# Patient Record
Sex: Male | Born: 1990 | Race: White | Hispanic: No | Marital: Married | Smoking: Current some day smoker
Health system: Southern US, Community
[De-identification: ages and names within clinical notes are randomized; demographics above are authoritative.]

## PROBLEM LIST (undated history)

## (undated) DIAGNOSIS — J45909 Unspecified asthma, uncomplicated: Secondary | ICD-10-CM

---
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
## 2009-04-15 ENCOUNTER — Emergency Department (HOSPITAL_COMMUNITY): Admission: EM | Admit: 2009-04-15 | Discharge: 2009-04-15 | Payer: Self-pay | Admitting: Emergency Medicine

## 2019-01-10 ENCOUNTER — Encounter (HOSPITAL_COMMUNITY): Payer: Self-pay

## 2019-01-10 ENCOUNTER — Ambulatory Visit (HOSPITAL_COMMUNITY)
Admission: EM | Admit: 2019-01-10 | Discharge: 2019-01-10 | Disposition: A | Discharge status: Home / Self Care | Payer: 59 | Attending: Family Medicine | Admitting: Family Medicine

## 2019-01-10 ENCOUNTER — Other Ambulatory Visit: Payer: Self-pay

## 2019-01-10 ENCOUNTER — Ambulatory Visit (INDEPENDENT_AMBULATORY_CARE_PROVIDER_SITE_OTHER): Payer: 59

## 2019-01-10 DIAGNOSIS — J4541 Moderate persistent asthma with (acute) exacerbation: Secondary | ICD-10-CM

## 2019-01-10 DIAGNOSIS — R0602 Shortness of breath: Secondary | ICD-10-CM | POA: Diagnosis not present

## 2019-01-10 DIAGNOSIS — R0789 Other chest pain: Secondary | ICD-10-CM | POA: Diagnosis not present

## 2019-01-10 HISTORY — DX: Unspecified asthma, uncomplicated: J45.909

## 2019-01-10 LAB — POC SARS CORONAVIRUS 2 AG -  ED: SARS Coronavirus 2 Ag: NEGATIVE

## 2019-01-10 LAB — POC SARS CORONAVIRUS 2 AG: SARS Coronavirus 2 Ag: NEGATIVE

## 2019-01-10 MED ORDER — PREDNISONE 20 MG PO TABS: ORAL_TABLET | ORAL | 0 refills | Status: AC

## 2019-01-10 NOTE — ED Provider Notes (Signed)
[place]    CSN: [id number] Arrival date & time: 01/10/19  1153      History   Chief Complaint Chief Complaint  Patient presents with  . Chest Pain    HPI Shawn Bray is a 28 y.o. male.   27 year old man making his initial [facility] Urgent care visit.  He is complaining about chest pain.  He has a history of asthma and his wife reports that he is wheezing more when he lies on his left side.  Onset two days ago.  Using inhalers as usual but the tightness in chest is not clearing.  Patient works as a Administrator, sports.  He normally gets asthma flares in the fall.     Past Medical History:  Diagnosis Date  . Asthma     There are no active problems to display for this patient.   History reviewed. No pertinent surgical history.     Home Medications    Prior to Admission medications   Medication Sig Start Date End Date Taking? Authorizing Provider  predniSONE (DELTASONE) 20 MG tablet Two daily with food 01/10/19   Robyn Haber, MD    Family History Family History  Problem Relation Age of Onset  . COPD Mother   . Healthy Father     Social History Social History   Tobacco Use  . Smoking status: Current Some Day Smoker  . Smokeless tobacco: Never Used  Substance Use Topics  . Alcohol use: Yes    Comment: weekends  . Drug use: Not on file     Allergies   Patient has no known allergies.   Review of Systems Review of Systems  Constitutional: Negative for appetite change, chills, diaphoresis, fatigue and fever.  HENT: Positive for congestion.   Respiratory: Positive for shortness of breath. Negative for cough.   Gastrointestinal: Negative.   Musculoskeletal: Negative.   All other systems reviewed and are negative.    Physical Exam Triage Vital Signs ED Triage Vitals  Enc Vitals Group     BP 01/10/19 1346 121/81     Pulse Rate 01/10/19 1346 63     Resp 01/10/19 1346 16     Temp 01/10/19 1346 98.2 F (36.8 C)     Temp Source  01/10/19 1346 Oral     SpO2 01/10/19 1346 99 %     Weight --      Height --      Head Circumference --      Peak Flow --      Pain Score 01/10/19 1344 0     Pain Loc --      Pain Edu? --      Excl. in [place]? --    No data found.  Updated Vital Signs BP 121/81 (BP Location: Right Arm)   Pulse 63   Temp 98.2 F (36.8 C) (Oral)   Resp 16   SpO2 99%    Physical Exam Vitals signs and nursing note reviewed.  Constitutional:      Appearance: He is well-developed and normal weight.  HENT:     Head: Normocephalic.  Eyes:     Extraocular Movements: Extraocular movements intact.  Neck:     Musculoskeletal: Normal range of motion and neck supple.  Cardiovascular:     Rate and Rhythm: Normal rate and regular rhythm.     Heart sounds: Normal heart sounds.  Pulmonary:     Effort: Pulmonary effort is normal.     Breath sounds: Examination of the  right-lower field reveals wheezing. Examination of the left-lower field reveals wheezing. Wheezing present.  Skin:    General: Skin is warm and dry.  Neurological:     General: No focal deficit present.     Mental Status: He is alert.  Psychiatric:        Mood and Affect: Mood normal.        Behavior: Behavior normal.      UC Treatments / Results  Labs (all labs ordered are listed, but only abnormal results are displayed) Labs Reviewed  POC SARS CORONAVIRUS 2 AG -  ED  POC SARS CORONAVIRUS 2 AG    EKG   Radiology Dg Chest 2 View  Result Date: 01/10/2019 CLINICAL DATA:  Shortness of breath, wheezing EXAM: CHEST - 2 VIEW COMPARISON:  None. FINDINGS: The heart size and mediastinal contours are within normal limits. Both lungs are clear. The visualized skeletal structures are unremarkable. IMPRESSION: No active cardiopulmonary disease. Electronically Signed   By: Duanne Guess M.D.   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 Linden Dolin feels that is a summation of rib and posterior fat pad Procedures Procedures (including critical care time)  Medications Ordered in UC Medications - No data to display  Initial Impression / Assessment and Plan / UC Course  I have reviewed the triage vital signs and the nursing notes.  Pertinent labs & imaging results that were available during my care of the patient were reviewed by me and considered in my medical decision making (see chart for details).    Final Clinical Impressions(s) / UC Diagnoses   Final diagnoses:  Moderate persistent asthma with acute exacerbation     Discharge Instructions     Your rapid COVID-19 test is negative.  We will run a confirmatory test that takes 3-5 days to run.    ED Prescriptions    Medication Sig Dispense Auth. Provider   predniSONE (DELTASONE) 20 MG tablet Two daily with food 10 tablet Elvina Sidle, MD     I have reviewed the PDMP during this encounter.   Elvina Sidle, MD 01/10/19 [phone]

## 2019-01-10 NOTE — Discharge Instructions (Addendum)
Your rapid COVID-19 test is negative.  We will run a confirmatory test that takes 3-5 days to run.  You have mild curvature of the spine on the x-ray which tends to make asthma more noticeable.  I've called in some prednisone to be taken twice daily.  If symptoms worsen or fail to improve over next 48 hrs, please return or go to Emergency Dept.

## 2019-01-10 NOTE — ED Triage Notes (Signed)
Patient presents to Urgent Care with complaints of chest pain on the left side since two nights ago. Patient reports he only has the pain when he exhales, hx of asthma, uses inhaler. Pt states his wife complains of the pt wheezing a lot when he sleeps on one side.

## 2020-10-17 IMAGING — DX DG CHEST 2V
2 series · 2 of 2 positions shown · non-contrast
Comparison: None.

CLINICAL DATA: Shortness of breath, wheezing

EXAM:
CHEST - 2 VIEW

[chest pa]
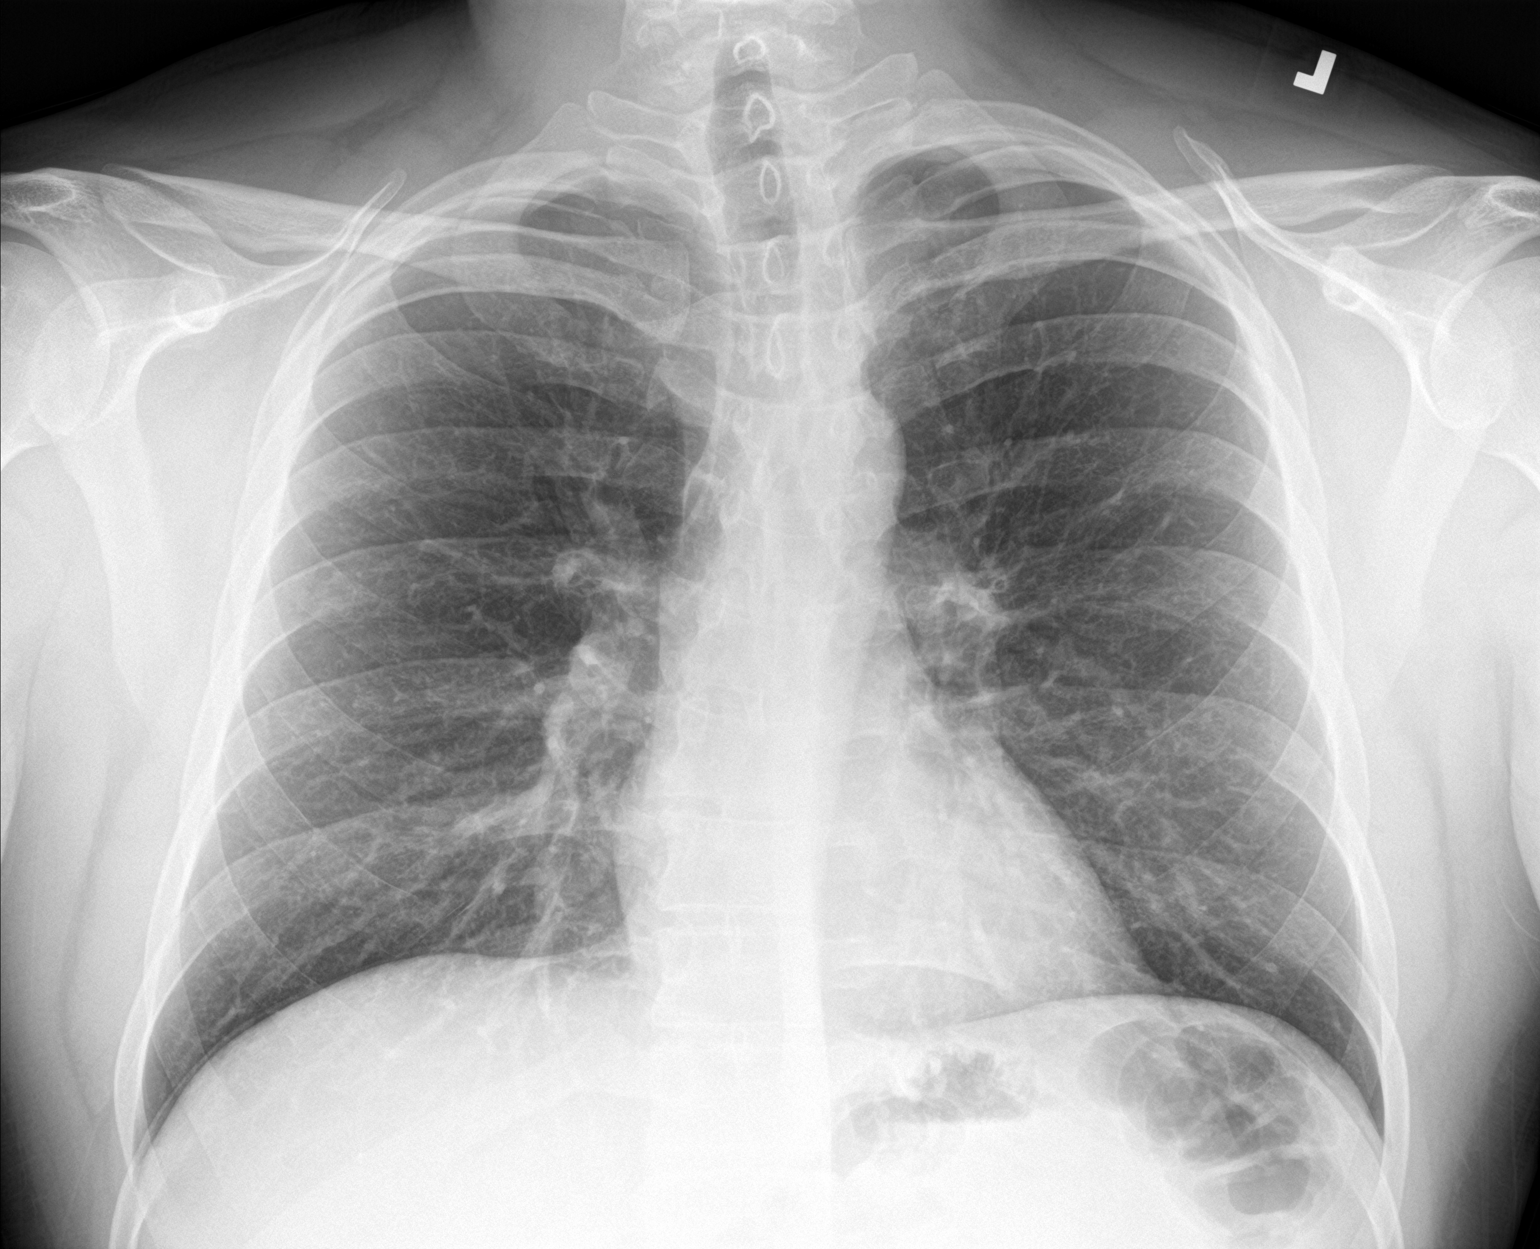

[chest lat]
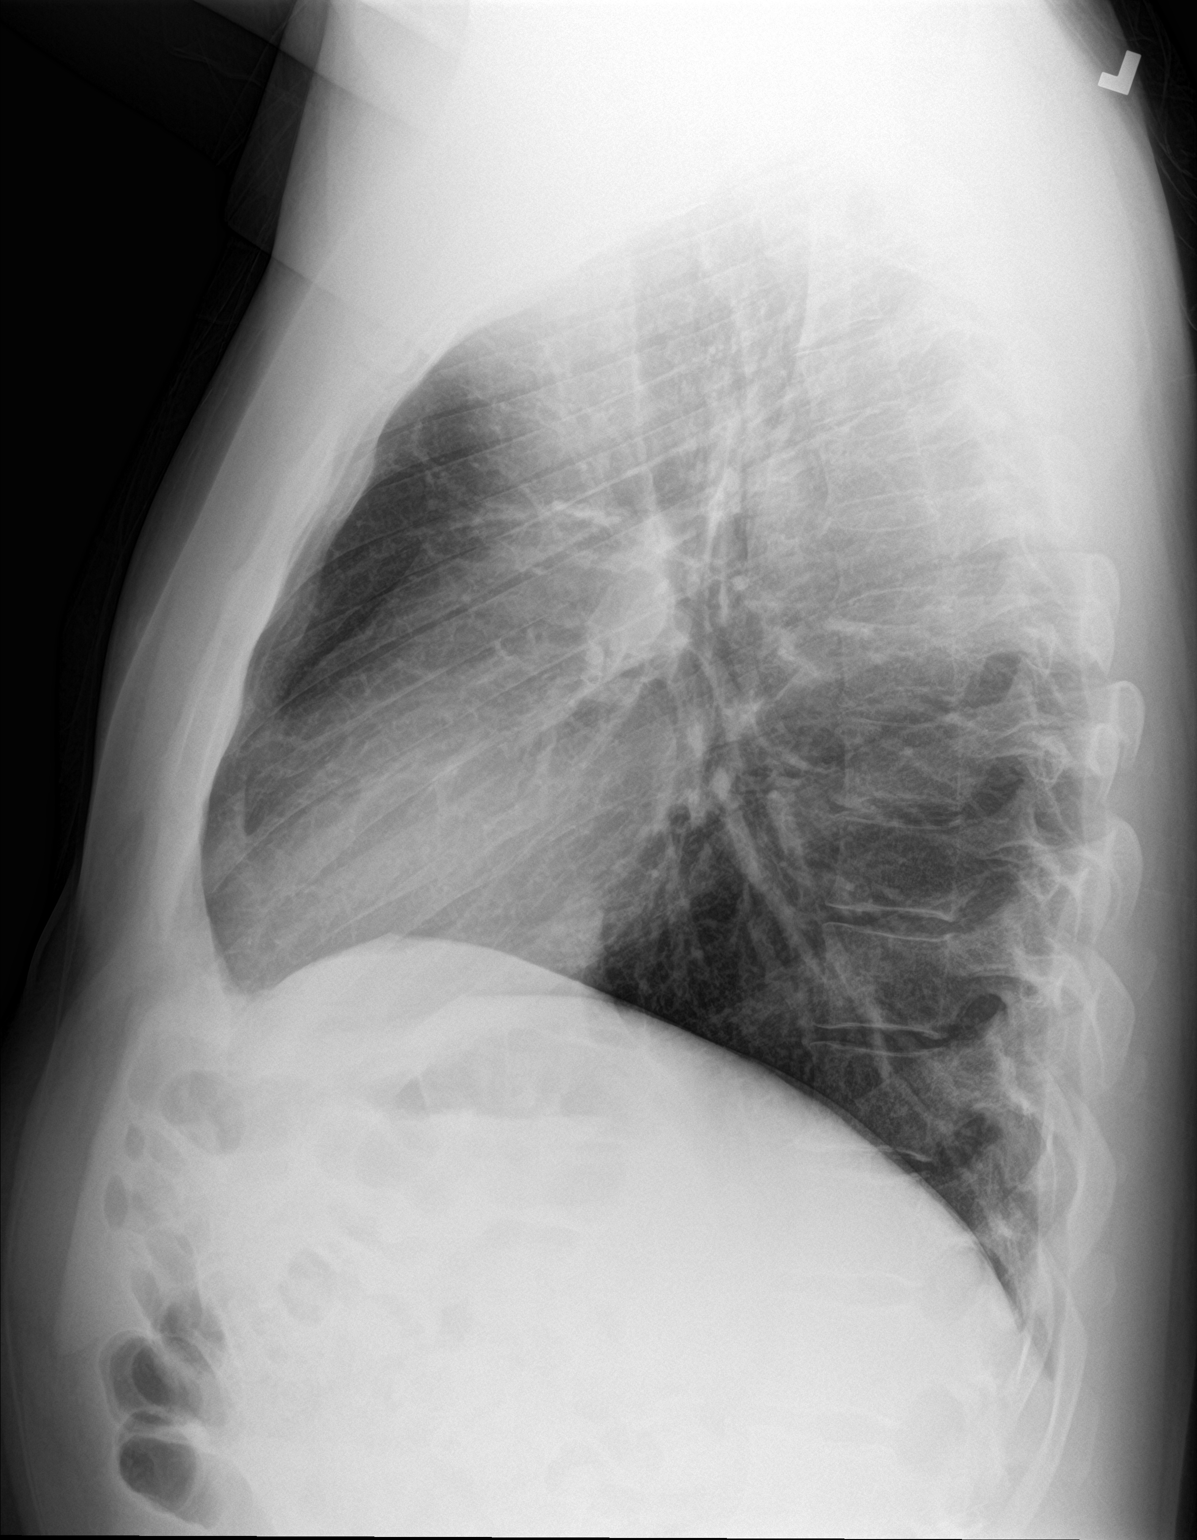

[2 of 2 positions shown; findings below may reference images not displayed]

FINDINGS: The heart size and mediastinal contours are within normal limits.
Both lungs are clear. The visualized skeletal structures are
unremarkable.
IMPRESSION: No active cardiopulmonary disease.

## 2021-02-12 DIAGNOSIS — J069 Acute upper respiratory infection, unspecified: Secondary | ICD-10-CM | POA: Diagnosis not present

## 2021-03-04 DIAGNOSIS — Z13220 Encounter for screening for lipoid disorders: Secondary | ICD-10-CM | POA: Diagnosis not present

## 2021-03-04 DIAGNOSIS — Z0000 Encounter for general adult medical examination without abnormal findings: Secondary | ICD-10-CM | POA: Diagnosis not present

## 2021-03-04 DIAGNOSIS — Z23 Encounter for immunization: Secondary | ICD-10-CM | POA: Diagnosis not present

## 2022-05-26 DIAGNOSIS — J069 Acute upper respiratory infection, unspecified: Secondary | ICD-10-CM | POA: Diagnosis not present

## 2022-07-16 DIAGNOSIS — Z0000 Encounter for general adult medical examination without abnormal findings: Secondary | ICD-10-CM | POA: Diagnosis not present

## 2022-07-16 DIAGNOSIS — E7800 Pure hypercholesterolemia, unspecified: Secondary | ICD-10-CM | POA: Diagnosis not present

## 2023-07-23 DIAGNOSIS — L02419 Cutaneous abscess of limb, unspecified: Secondary | ICD-10-CM | POA: Diagnosis not present

## 2023-07-23 DIAGNOSIS — E7800 Pure hypercholesterolemia, unspecified: Secondary | ICD-10-CM | POA: Diagnosis not present

## 2023-07-23 DIAGNOSIS — Z0000 Encounter for general adult medical examination without abnormal findings: Secondary | ICD-10-CM | POA: Diagnosis not present

## 2023-07-23 DIAGNOSIS — L2084 Intrinsic (allergic) eczema: Secondary | ICD-10-CM | POA: Diagnosis not present

## 2023-08-26 DIAGNOSIS — H5789 Other specified disorders of eye and adnexa: Secondary | ICD-10-CM | POA: Diagnosis not present

## 2023-08-26 DIAGNOSIS — H1045 Other chronic allergic conjunctivitis: Secondary | ICD-10-CM | POA: Diagnosis not present

## 2023-08-26 DIAGNOSIS — H04123 Dry eye syndrome of bilateral lacrimal glands: Secondary | ICD-10-CM | POA: Diagnosis not present

## 2023-09-21 DIAGNOSIS — L0291 Cutaneous abscess, unspecified: Secondary | ICD-10-CM | POA: Diagnosis not present

## 2023-11-03 DIAGNOSIS — L0291 Cutaneous abscess, unspecified: Secondary | ICD-10-CM | POA: Diagnosis not present

## 2023-11-03 DIAGNOSIS — L600 Ingrowing nail: Secondary | ICD-10-CM | POA: Diagnosis not present

## 2023-11-03 DIAGNOSIS — Z6832 Body mass index (BMI) 32.0-32.9, adult: Secondary | ICD-10-CM | POA: Diagnosis not present

## 2024-02-09 DIAGNOSIS — L732 Hidradenitis suppurativa: Secondary | ICD-10-CM | POA: Diagnosis not present
